# Patient Record
Sex: Female | Born: 1970 | Race: White | Hispanic: No | Smoking: Never smoker
Health system: Southern US, Community
[De-identification: ages and names within clinical notes are randomized; demographics above are authoritative.]

## PROBLEM LIST (undated history)

## (undated) DIAGNOSIS — K5792 Diverticulitis of intestine, part unspecified, without perforation or abscess without bleeding: Secondary | ICD-10-CM

## (undated) HISTORY — PX: APPENDECTOMY: SHX54

## (undated) HISTORY — PX: HERNIA REPAIR: SHX51

## (undated) HISTORY — PX: ABDOMINAL SURGERY: SHX537

## (undated) HISTORY — PX: OTHER SURGICAL HISTORY: SHX169

---

## 2003-03-05 ENCOUNTER — Other Ambulatory Visit: Payer: Self-pay

## 2003-10-15 ENCOUNTER — Other Ambulatory Visit: Payer: Self-pay

## 2003-12-18 ENCOUNTER — Emergency Department: Payer: Self-pay | Admitting: General Practice

## 2004-01-24 ENCOUNTER — Ambulatory Visit: Payer: Self-pay | Admitting: General Surgery

## 2004-02-03 ENCOUNTER — Ambulatory Visit: Payer: Self-pay | Admitting: General Surgery

## 2004-02-16 ENCOUNTER — Emergency Department: Payer: Self-pay | Admitting: Emergency Medicine

## 2004-02-17 ENCOUNTER — Ambulatory Visit: Payer: Self-pay | Admitting: Emergency Medicine

## 2004-04-05 ENCOUNTER — Emergency Department: Payer: Self-pay | Admitting: Unknown Physician Specialty

## 2007-07-06 ENCOUNTER — Emergency Department: Payer: Self-pay | Admitting: Emergency Medicine

## 2007-07-06 ENCOUNTER — Other Ambulatory Visit: Payer: Self-pay

## 2008-10-23 ENCOUNTER — Emergency Department: Payer: Self-pay | Admitting: Emergency Medicine

## 2010-04-25 ENCOUNTER — Ambulatory Visit: Payer: Self-pay | Admitting: Family Medicine

## 2010-05-11 ENCOUNTER — Ambulatory Visit: Payer: Self-pay | Admitting: General Surgery

## 2010-06-07 ENCOUNTER — Emergency Department: Payer: Self-pay | Admitting: Emergency Medicine

## 2010-06-11 ENCOUNTER — Emergency Department: Payer: Self-pay | Admitting: Emergency Medicine

## 2015-01-31 ENCOUNTER — Emergency Department: Payer: Self-pay

## 2015-01-31 ENCOUNTER — Encounter: Payer: Self-pay | Admitting: Emergency Medicine

## 2015-01-31 ENCOUNTER — Emergency Department
Admission: EM | Admit: 2015-01-31 | Discharge: 2015-02-01 | Disposition: A | Discharge status: Home / Self Care | Payer: Self-pay | Attending: Emergency Medicine | Admitting: Emergency Medicine

## 2015-01-31 DIAGNOSIS — R1010 Upper abdominal pain, unspecified: Secondary | ICD-10-CM | POA: Insufficient documentation

## 2015-01-31 DIAGNOSIS — Z3202 Encounter for pregnancy test, result negative: Secondary | ICD-10-CM | POA: Insufficient documentation

## 2015-01-31 DIAGNOSIS — R110 Nausea: Secondary | ICD-10-CM | POA: Insufficient documentation

## 2015-01-31 HISTORY — DX: Diverticulitis of intestine, part unspecified, without perforation or abscess without bleeding: K57.92

## 2015-01-31 LAB — CBC
HCT: 44.2 % (ref 35.0–47.0)
Hemoglobin: 14.3 g/dL (ref 12.0–16.0)
MCH: 30.1 pg (ref 26.0–34.0)
MCHC: 32.3 g/dL (ref 32.0–36.0)
MCV: 93.1 fL (ref 80.0–100.0)
Platelets: 215 10*3/uL (ref 150–440)
RBC: 4.75 MIL/uL (ref 3.80–5.20)
RDW: 12.8 % (ref 11.5–14.5)
WBC: 12.9 10*3/uL — ABNORMAL HIGH (ref 3.6–11.0)

## 2015-01-31 LAB — COMPREHENSIVE METABOLIC PANEL
ALBUMIN: 4.8 g/dL (ref 3.5–5.0)
ALK PHOS: 49 U/L (ref 38–126)
ALT: 12 U/L — AB (ref 14–54)
AST: 18 U/L (ref 15–41)
Anion gap: 7 (ref 5–15)
BILIRUBIN TOTAL: 0.1 mg/dL — AB (ref 0.3–1.2)
BUN: 10 mg/dL (ref 6–20)
CALCIUM: 9.3 mg/dL (ref 8.9–10.3)
CO2: 25 mmol/L (ref 22–32)
Chloride: 106 mmol/L (ref 101–111)
Creatinine, Ser: 0.73 mg/dL (ref 0.44–1.00)
GFR calc Af Amer: >60 mL/min (ref >60)
GFR calc non Af Amer: >60 mL/min (ref >60)
GLUCOSE: 129 mg/dL — AB (ref 65–99)
Potassium: 3.8 mmol/L (ref 3.5–5.1)
Sodium: 138 mmol/L (ref 135–145)
TOTAL PROTEIN: 7.7 g/dL (ref 6.5–8.1)

## 2015-01-31 LAB — URINALYSIS COMPLETE WITH MICROSCOPIC (ARMC ONLY)
BACTERIA UA: NONE SEEN
Bilirubin Urine: NEGATIVE
GLUCOSE, UA: NEGATIVE mg/dL
Ketones, ur: NEGATIVE mg/dL
NITRITE: NEGATIVE
Protein, ur: NEGATIVE mg/dL
SPECIFIC GRAVITY, URINE: 1.018 (ref 1.005–1.030)
pH: 6 (ref 5.0–8.0)

## 2015-01-31 LAB — POCT PREGNANCY, URINE: Preg Test, Ur: NEGATIVE

## 2015-01-31 LAB — TROPONIN I

## 2015-01-31 LAB — LIPASE, BLOOD: Lipase: 26 U/L (ref 11–51)

## 2015-01-31 MED ORDER — IOHEXOL 300 MG/ML  SOLN
100.0000 mL | Freq: Once | INTRAMUSCULAR | Status: AC | PRN
  Administered 2015-01-31: 100 mL via INTRAVENOUS

## 2015-01-31 MED ORDER — MORPHINE SULFATE (PF) 4 MG/ML IV SOLN
4.0000 mg | Freq: Once | INTRAVENOUS | Status: AC
  Administered 2015-01-31: 4 mg via INTRAVENOUS
  Filled 2015-01-31: qty 1

## 2015-01-31 MED ORDER — ONDANSETRON HCL 4 MG/2ML IJ SOLN
4.0000 mg | Freq: Once | INTRAMUSCULAR | Status: AC
  Administered 2015-01-31: 4 mg via INTRAVENOUS
  Filled 2015-01-31: qty 2

## 2015-01-31 MED ORDER — SODIUM CHLORIDE 0.9 % IV BOLUS (SEPSIS)
1000.0000 mL | Freq: Once | INTRAVENOUS | Status: AC
  Administered 2015-01-31: 1000 mL via INTRAVENOUS

## 2015-01-31 MED ORDER — IOHEXOL 240 MG/ML SOLN
25.0000 mL | Freq: Once | INTRAMUSCULAR | Status: AC | PRN
  Administered 2015-01-31: 25 mL via ORAL

## 2015-01-31 NOTE — ED Notes (Signed)
Patient presents with c/o upper abd pain x 2 weeks; worse x 2-3 days. Patient reports PMH significant for hernia repair x 3 and celiac artery decompression. Patient reports that she has been doing a lot of lifting; questions hernia rupture. (+) nausea.  

## 2015-01-31 NOTE — ED Provider Notes (Signed)
[facility] Emergency Department Provider Note  ____________________________________________  Time seen: 11:00 PM  I have reviewed the triage vital signs and the nursing notes.   HISTORY  Chief Complaint Abdominal Pain    HPI Alyssa Page is a 44 y.o. female presents with upper abdominal pain 2 weeks with acute worsening the past 2-3 days. Patient states current pain score 7 out of 10 and accompanied by nausea however no vomiting or diarrhea. Of note the patient has a history of celiac artery decompression as well as abdominal hernias and diverticulitis.     Past medical history Celiac artery decompression There are no active problems to display for this patient.   Past Surgical History  Procedure Laterality Date  . Hernia repair      x 3  . Celiac artery decompression      No current outpatient prescriptions on file.  Allergies Zantac  No family history on file.  Social History Social History  Substance Use Topics  . Smoking status: Never Smoker   . Smokeless tobacco: None  . Alcohol Use: Yes     Comment: "Occassional glass of wine"    Review of Systems  Constitutional: Negative for fever. Eyes: Negative for visual changes. ENT: Negative for sore throat. Cardiovascular: Negative for chest pain. Respiratory: Negative for shortness of breath. Gastrointestinal: Positive for abdominal pain and nausea Genitourinary: Negative for dysuria. Musculoskeletal: Negative for back pain. Skin: Negative for rash. Neurological: Negative for headaches, focal weakness or numbness.  10-point ROS otherwise negative.  ____________________________________________   PHYSICAL EXAM:  VITAL SIGNS: ED Triage Vitals  Enc Vitals Group     BP 01/31/15 2055 154/90 mmHg     Pulse Rate 01/31/15 2055 89     Resp 01/31/15 2055 16     Temp 01/31/15 2055 98.3 F (36.8 C)     Temp Source 01/31/15 2055 Oral     SpO2 01/31/15 2055 100 %     Weight  01/31/15 2055 160 lb (72.576 kg)     Height 01/31/15 2055  (1.6 m)     Head Cir --      Peak Flow --      Pain Score 01/31/15 2055 6     Pain Loc --      Pain Edu? --      Excl. in GC? --      Constitutional: Alert and oriented. Well appearing and in no distress. Eyes: Conjunctivae are normal. PERRL. Normal extraocular movements. ENT   Head: Normocephalic and atraumatic.   Nose: No congestion/rhinnorhea.   Mouth/Throat: Mucous membranes are moist.   Neck: No stridor. Hematological/Lymphatic/Immunilogical: No cervical lymphadenopathy. Cardiovascular: Normal rate, regular rhythm. Normal and symmetric distal pulses are present in all extremities. No murmurs, rubs, or gallops. Respiratory: Normal respiratory effort without tachypnea nor retractions. Breath sounds are clear and equal bilaterally. No wheezes/rales/rhonchi. Gastrointestinal: Tender to palpation right upper quadrant epigastric and left upper quadrant.. No distention. There is no CVA tenderness. Genitourinary: deferred Musculoskeletal: Nontender with normal range of motion in all extremities. No joint effusions.  No lower extremity tenderness nor edema. Neurologic:  Normal speech and language. No gross focal neurologic deficits are appreciated. Speech is normal.  Skin:  Skin is warm, dry and intact. No rash noted. Psychiatric: Mood and affect are normal. Speech and behavior are normal. Patient exhibits appropriate insight and judgment.  ____________________________________________    LABS (pertinent positives/negatives)  Labs Reviewed  COMPREHENSIVE METABOLIC PANEL - Abnormal; Notable for the following:  Glucose, Bld 129 (*)    ALT 12 (*)    Total Bilirubin 0.1 (*)    All other components within normal limits  CBC - Abnormal; Notable for the following:    WBC 12.9 (*)    All other components within normal limits  URINALYSIS COMPLETEWITH MICROSCOPIC ([facility] ONLY) - Abnormal; Notable for the  following:    Color, Urine YELLOW (*)    APPearance HAZY (*)    Hgb urine dipstick 1+ (*)    Leukocytes, UA TRACE (*)    Squamous Epithelial / LPF 6-30 (*)    All other components within normal limits  LIPASE, BLOOD  TROPONIN I  POCT PREGNANCY, URINE      RADIOLOGY  CT Abdomen Pelvis W Contrast (Final result) Result time: 01/31/15 23:48:07   Final result by Rad Results In Interface (01/31/15 23:48:07)   Narrative:   CLINICAL DATA: Upper abdominal pain for 3 days.  EXAM: CT ABDOMEN AND PELVIS WITH CONTRAST  TECHNIQUE: Multidetector CT imaging of the abdomen and pelvis was performed using the standard protocol following bolus administration of intravenous contrast.  CONTRAST: 100mL OMNIPAQUE IOHEXOL 300 MG/ML SOLN  COMPARISON: 05/11/2010  FINDINGS: Lower chest: No significant abnormality  Hepatobiliary: There are normal appearances of the liver, gallbladder and bile ducts.  Pancreas: Normal  Spleen: Normal  Adrenals/Urinary Tract: The adrenals and kidneys are normal in appearance. There is no urinary calculus evident. There is no hydronephrosis or ureteral dilatation. Collecting systems and ureters appear unremarkable.  Stomach/Bowel: The stomach, small bowel and colon are unremarkable.  Vascular/Lymphatic: The abdominal aorta is normal in caliber. There is no atherosclerotic calcification. There is no adenopathy in the abdomen or pelvis.  Reproductive: Uterus and ovaries are normal.  Other: No acute inflammatory changes are evident in the abdomen or pelvis. There is no ascites.  Musculoskeletal: No significant abnormality.  IMPRESSION: No significant abnormality.   Electronically Signed By: Ellery Plunkaniel R Mitchell M.D. On: 01/31/2015 23:48      INITIAL IMPRESSION / ASSESSMENT AND PLAN / ED COURSE  Pertinent labs & imaging results that were available during my care of the patient were reviewed by me and considered in my medical decision  making (see chart for details).  Given H&P concern for intra-abdominal pathology however CT abdomen negative. [facility] evaluation.  ____________________________________________   FINAL CLINICAL IMPRESSION(S) / ED DIAGNOSES  Final diagnoses:  Pain of upper abdomen      Darci Currentandolph N Brown, MD 02/01/15 [id number]

## 2015-01-31 NOTE — ED Notes (Addendum)
Pt presents to ED presenting with upper abdominal pain for 3 days. Pt states she has had 4 abdominal surgeries in the past. Pt has hx of hernia, diverticulitis, has mesh in abdomen. Pt states she still has gallbladder.

## 2015-01-31 NOTE — ED Notes (Signed)
Dr. Brown at bedside

## 2015-01-31 NOTE — ED Notes (Signed)
Patient presents with c/o upper abd pain x 2 weeks; worse x 2-3 days. Patient reports PMH significant for hernia repair x 3 and celiac artery decompression. Patient reports that she has been doing a lot of lifting; questions hernia rupture. (+) nausea.

## 2015-01-31 NOTE — ED Notes (Signed)
Pt transported to CT via stretcher.  

## 2015-02-01 MED ORDER — OXYCODONE-ACETAMINOPHEN 5-325 MG PO TABS: 1.0000 | ORAL_TABLET | ORAL | Status: AC | PRN

## 2015-02-01 NOTE — Discharge Instructions (Signed)

## 2015-02-01 NOTE — ED Notes (Signed)
Dr. Brown at bedside

## 2015-11-28 ENCOUNTER — Emergency Department
Admission: EM | Admit: 2015-11-28 | Discharge: 2015-11-28 | Disposition: A | Discharge status: Home / Self Care | Payer: 59 | Attending: Emergency Medicine | Admitting: Emergency Medicine

## 2015-11-28 ENCOUNTER — Encounter: Payer: Self-pay | Admitting: Medical Oncology

## 2015-11-28 ENCOUNTER — Emergency Department: Payer: 59

## 2015-11-28 DIAGNOSIS — R0789 Other chest pain: Secondary | ICD-10-CM

## 2015-11-28 LAB — CBC
HEMATOCRIT: 42.5 % (ref 35.0–47.0)
HEMOGLOBIN: 14.3 g/dL (ref 12.0–16.0)
MCH: 31.4 pg (ref 26.0–34.0)
MCHC: 33.6 g/dL (ref 32.0–36.0)
MCV: 93.4 fL (ref 80.0–100.0)
Platelets: 263 10*3/uL (ref 150–440)
RBC: 4.56 MIL/uL (ref 3.80–5.20)
RDW: 13 % (ref 11.5–14.5)
WBC: 11.7 10*3/uL — AB (ref 3.6–11.0)

## 2015-11-28 LAB — BASIC METABOLIC PANEL
ANION GAP: 7 (ref 5–15)
BUN: 8 mg/dL (ref 6–20)
CO2: 23 mmol/L (ref 22–32)
Calcium: 8.6 mg/dL — ABNORMAL LOW (ref 8.9–10.3)
Chloride: 106 mmol/L (ref 101–111)
Creatinine, Ser: 0.79 mg/dL (ref 0.44–1.00)
GFR calc Af Amer: >60 mL/min (ref >60)
GLUCOSE: 92 mg/dL (ref 65–99)
POTASSIUM: 3.8 mmol/L (ref 3.5–5.1)
Sodium: 136 mmol/L (ref 135–145)

## 2015-11-28 LAB — TROPONIN I: Troponin I: <0.03 ng/mL (ref <0.03)

## 2015-11-28 MED ORDER — CARISOPRODOL 350 MG PO TABS: 350.0000 mg | ORAL_TABLET | Freq: Three times a day (TID) | ORAL | 0 refills | Status: AC | PRN

## 2015-11-28 NOTE — ED Notes (Signed)
Dr. Schaevitz at bedside.  

## 2015-11-28 NOTE — ED Notes (Signed)
Pt states x 2 month intermittent pain to L side around rib cage. Denies kidney stone hx, denies urinary symptoms. States pain with come on and stay for a week and then go away for a week. Pt denies known injury to area. States pain with deep breathing.

## 2015-11-28 NOTE — ED Triage Notes (Signed)
Pt reports for the past 2 months she has been having intermittent left sided rib pain that wraps around to her back.

## 2015-11-28 NOTE — ED Provider Notes (Signed)
[facility] Emergency Department Provider Note   ____________________________________________   First MD Initiated Contact with Patient 11/28/15 1453     (approximate)  I have reviewed the triage vital signs and the nursing notes.   HISTORY  Chief Complaint Flank Pain and Chest Pain   HPI Alyssa Page is a 45 y.o. female with a history of diverticulitis as well as multiple hernia repairs was presented to emergency department with left lower chest and left flank pain. She says that the symptoms have been off and on over the past 2 months. She says that the pain will last week and then go away for a week. She says that it is worse with deep breathing and is a 6 out of 10 in intensity at this time. She denies any associated symptoms is nausea vomiting, diarrhea or shortness of breath. She says that the symptoms are also worsened at her job. She says the reason she is here today is because her job wanted her to be evaluated. 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 She says that the pain is worsened with heavy lifting but she persists because it is the requirements of her employment.  She says she is on Depo-Provera and also has her tubes tied. Denies any burning with urination or blood in her urine. Says that the pain as a cramping pain and is improved when she puts pressure on it with her hand. She says that she has been using icy hot, heating pad as well as ibuprofen and Aleve at home without relief.   Past Medical History:  Diagnosis Date  . Diverticulitis     There are no active problems to display for this patient.   Past Surgical History:  Procedure Laterality Date  . ABDOMINAL SURGERY    . APPENDECTOMY    . Celiac artery decompression    . HERNIA REPAIR     x 3    Prior to Admission medications   Medication Sig Start Date End Date Taking? Authorizing Provider  oxyCODONE-acetaminophen (ROXICET)  5-325 MG tablet Take 1 tablet by mouth every 4 (four) hours as needed for severe pain. 02/01/15   Darci Currentandolph N Brown, MD    Allergies Zantac [ranitidine hcl]  No family history on file.  Social History Social History  Substance Use Topics  . Smoking status: Never Smoker  . Smokeless tobacco: Not on file  . Alcohol use Yes     Comment: "Occassional glass of wine"    Review of Systems \\Constitutional : No fever/chills Eyes: No visual changes. ENT: No sore throat. Cardiovascular:As above Respiratory: Denies shortness of breath. Gastrointestinal: No abdominal pain.  No nausea, no vomiting.  No diarrhea.  No constipation. Genitourinary: Negative for dysuria. Musculoskeletal: Negative for back pain. Skin: Negative for rash. Neurological: Negative for headaches, focal weakness or numbness. ` 10-point ROS otherwise negative.  ____________________________________________   PHYSICAL EXAM:  VITAL SIGNS: ED Triage Vitals [11/28/15 1404]  Enc Vitals Group     BP (!) 151/87     Pulse Rate 86     Resp 17     Temp 98.4 F (36.9 C)     Temp Source Oral     SpO2 100 %     Weight 150 lb (68 kg)     Height 5\' 3"  (1.6 m)     Head Circumference      Peak Flow      Pain Score 7  Pain Loc      Pain Edu?      Excl. in GC?     Constitutional: Alert and oriented. Well appearing and in no acute distress. Eyes: Conjunctivae are normal. PERRL. EOMI. Head: Atraumatic. Nose: No congestion/rhinnorhea. Mouth/Throat: Mucous membranes are moist.   Neck: No stridor.   Cardiovascular: Normal rate, regular rhythm. Grossly normal heart sounds.   Respiratory: Normal respiratory effort.  No retractions. Lungs CTAB. Gastrointestinal: Soft and nontender. No distention. No CVA tenderness. Musculoskeletal: No lower extremity tenderness nor edema.  No joint effusions.  Tenderness to the left lower thorax over ribs 9 through 11 anteriorly without any crepitus, no overlying ecchymosis or bruising.  Tenderness to palpation is mild. No focal/point tenderness palpation. Neurologic:  Normal speech and language. No gross focal neurologic deficits are appreciated. Skin:  Skin is warm, dry and intact. No rash noted. Psychiatric: Mood and affect are normal. Speech and behavior are normal.  ____________________________________________   LABS (all labs ordered are listed, but only abnormal results are displayed)  Labs Reviewed  BASIC METABOLIC PANEL - Abnormal; Notable for the following:       Result Value   Calcium 8.6 (*)    All other components within normal limits  CBC - Abnormal; Notable for the following:    WBC 11.7 (*)    All other components within normal limits  TROPONIN I   ____________________________________________  EKG  ED ECG REPORT I, Arelia Longest, the attending physician, personally viewed and interpreted this ECG.   Date: 11/28/2015  EKG Time: 1410  Rate: 69  Rhythm: normal sinus rhythm  Axis: Normal axis  Intervals:none  ST&T Change: No ST segment elevation or depression. No ab normal T-wave inversion.  ____________________________________________  RADIOLOGY  DG Chest 2 View (Accession [id number]) (Order [id number])  Imaging  Date: 11/28/2015 Department: [facility] EMERGENCY DEPARTMENT Released By: Jyl Heinz, RN (auto-released) Authorizing: Myrna Blazer, MD  PACS Images   Show images for DG Chest 2 View  Study Result   CLINICAL DATA:  Rib pain for 2 months on the left.  EXAM: CHEST  2 VIEW  COMPARISON:  10/23/2008  FINDINGS: No findings in the ribs to explain pain. No focal or notable thoracic spine finding. There is no edema, consolidation, effusion, or pneumothorax. Normal heart size and mediastinal contours.  IMPRESSION: Stable, negative chest.   Electronically Signed   By: Marnee Spring M.D.   On: 11/28/2015 15:06      ____________________________________________   PROCEDURES  Procedure(s) performed:   Procedures  Critical Care performed:   ____________________________________________   INITIAL IMPRESSION / ASSESSMENT AND PLAN / ED COURSE  Pertinent labs & imaging results that were available during my care of the patient were reviewed by me and considered in my medical decision making (see chart for details).  Patient with likely chest wall pain. Multiple months of symptoms with a very reassuring workup. We'll add a muscle relaxer to her medications for symptomatic relief. Will be discharged home. Has an appointment with a doctor from [facility] this Monday for primary care. Elevated blood pressure but will need blood pressure recheck. Unclear if this is essential hypertension versus whitecoat hypertension versus pain related.  PERC negative.   Clinical Course     ____________________________________________   FINAL CLINICAL IMPRESSION(S) / ED DIAGNOSES  Chest wall pain.    NEW MEDICATIONS STARTED DURING THIS VISIT:  New Prescriptions   No medications on file     Note:  This document was prepared using Dragon voice recognition software and may include unintentional dictation errors.    Myrna Blazer, MD 11/28/15 [phone]

## 2015-12-28 ENCOUNTER — Emergency Department: Payer: 59

## 2015-12-28 ENCOUNTER — Emergency Department
Admission: EM | Admit: 2015-12-28 | Discharge: 2015-12-28 | Disposition: A | Discharge status: Home / Self Care | Payer: 59 | Attending: Emergency Medicine | Admitting: Emergency Medicine

## 2015-12-28 DIAGNOSIS — R002 Palpitations: Secondary | ICD-10-CM | POA: Insufficient documentation

## 2015-12-28 DIAGNOSIS — R0789 Other chest pain: Secondary | ICD-10-CM | POA: Diagnosis not present

## 2015-12-28 DIAGNOSIS — Z791 Long term (current) use of non-steroidal anti-inflammatories (NSAID): Secondary | ICD-10-CM | POA: Diagnosis not present

## 2015-12-28 LAB — BASIC METABOLIC PANEL
ANION GAP: 9 (ref 5–15)
BUN: 9 mg/dL (ref 6–20)
CALCIUM: 8.9 mg/dL (ref 8.9–10.3)
CO2: 24 mmol/L (ref 22–32)
Chloride: 104 mmol/L (ref 101–111)
Creatinine, Ser: 0.7 mg/dL (ref 0.44–1.00)
GFR calc Af Amer: >60 mL/min (ref >60)
GLUCOSE: 138 mg/dL — AB (ref 65–99)
POTASSIUM: 3.8 mmol/L (ref 3.5–5.1)
SODIUM: 137 mmol/L (ref 135–145)

## 2015-12-28 LAB — CBC
HEMATOCRIT: 43 % (ref 35.0–47.0)
HEMOGLOBIN: 14.4 g/dL (ref 12.0–16.0)
MCH: 31.2 pg (ref 26.0–34.0)
MCHC: 33.6 g/dL (ref 32.0–36.0)
MCV: 93 fL (ref 80.0–100.0)
Platelets: 255 10*3/uL (ref 150–440)
RBC: 4.62 MIL/uL (ref 3.80–5.20)
RDW: 12.8 % (ref 11.5–14.5)
WBC: 15.6 10*3/uL — AB (ref 3.6–11.0)

## 2015-12-28 LAB — TROPONIN I

## 2015-12-28 NOTE — ED Triage Notes (Signed)
Pt feels like her heart is racing X 2 hours, nausea, lightheaded. Pt alert and oriented X4, active, cooperative, pt in NAD. RR even and unlabored, color WNL.

## 2015-12-28 NOTE — ED Provider Notes (Signed)
[facility] Emergency Department Provider Note  ____________________________________________   First MD Initiated Contact with Patient 12/28/15 1720     (approximate)  I have reviewed the triage vital signs and the nursing notes.   HISTORY  Chief Complaint Palpitations   HPI Alyssa Page is a 45 y.o. female with a history of diverticulitis with several months of on and off palpitations. She said that she was having palpitations earlier today that started at work. She said that she was sitting at work when the palpitations started. She says that it feels like her heart is racing and there is mild chest pressure. She has been seen in this ER about one month ago as well as at [facility] with her primary care doctor. She has worn a monitor but is waiting for those results. She was seen by her primary care doctor several days ago who diagnosed with with pleurisy. The patient had a negative d-dimer at that time. The patient is symptom-free at this time. No shortness of breath. Denies any nausea vomiting or diarrhea.   Past Medical History:  Diagnosis Date  . Diverticulitis     There are no active problems to display for this patient.   Past Surgical History:  Procedure Laterality Date  . ABDOMINAL SURGERY    . APPENDECTOMY    . Celiac artery decompression    . HERNIA REPAIR     x 3    Prior to Admission medications   Medication Sig Start Date End Date Taking? Authorizing Provider  carisoprodol (SOMA) 350 MG tablet Take 1 tablet (350 mg total) by mouth 3 (three) times daily as needed for muscle spasms. 11/28/15 11/27/16  Myrna Blazer, MD  oxyCODONE-acetaminophen (ROXICET) 5-325 MG tablet Take 1 tablet by mouth every 4 (four) hours as needed for severe pain. 02/01/15   Darci Current, MD    Allergies Zantac [ranitidine hcl]  No family history on file.  Social History Social History  Substance Use Topics  . Smoking status: Never Smoker  .  Smokeless tobacco: Not on file  . Alcohol use Yes     Comment: "Occassional glass of wine"    Review of Systems Constitutional: No fever/chills Eyes: No visual changes. ENT: No sore throat. Cardiovascular: As above Respiratory: Denies shortness of breath. Gastrointestinal: No abdominal pain.  No nausea, no vomiting.  No diarrhea.  No constipation. Genitourinary: Negative for dysuria. Musculoskeletal: Negative for back pain. Skin: Negative for rash. Neurological: Negative for headaches, focal weakness or numbness.  10-point ROS otherwise negative.  ____________________________________________   PHYSICAL EXAM:  VITAL SIGNS: ED Triage Vitals  Enc Vitals Group     BP 12/28/15 1620 (!) 152/93     Pulse Rate 12/28/15 1620 (!) 108     Resp 12/28/15 1620 18     Temp 12/28/15 1620 98.8 F (37.1 C)     Temp Source 12/28/15 1620 Oral     SpO2 12/28/15 1620 99 %     Weight 12/28/15 1621 150 lb (68 kg)     Height 12/28/15 1621 5\' 3"  (1.6 m)     Head Circumference --      Peak Flow --      Pain Score --      Pain Loc --      Pain Edu? --      Excl. in GC? --     Constitutional: Alert and oriented. Well appearing and in no acute distress. Eyes: Conjunctivae are normal. PERRL. EOMI.  Head: Atraumatic. Nose: No congestion/rhinnorhea. Mouth/Throat: Mucous membranes are moist.   Neck: No stridor.    Cardiovascular: Normal rate, regular rhythm. Grossly normal heart sounds.  Heart rate of 84 and the monitor in the room. Respiratory: Normal respiratory effort.  No retractions. Lungs CTAB. Gastrointestinal: Soft and nontender. No distention.  Musculoskeletal: No lower extremity tenderness nor edema.  No joint effusions. Neurologic:  Normal speech and language. No gross focal neurologic deficits are appreciated. No gait instability. Skin:  Skin is warm, dry and intact. No rash noted. Psychiatric: Mood and affect are normal. Speech and behavior are  normal.  ____________________________________________   LABS (all labs ordered are listed, but only abnormal results are displayed)  Labs Reviewed  BASIC METABOLIC PANEL - Abnormal; Notable for the following:       Result Value   Glucose, Bld 138 (*)    All other components within normal limits  CBC - Abnormal; Notable for the following:    WBC 15.6 (*)    All other components within normal limits  TROPONIN I   ____________________________________________  EKG  ED ECG REPORT I, Arelia LongestSchaevitz,  Ilya Neely M, the attending physician, personally viewed and interpreted this ECG.   Date: 12/28/2015  EKG Time: 1619  Rate: 105  Rhythm: sinus tachycardia  Axis: Normal  Intervals:none  ST&T Change: No ST segment elevation or depression. No abnormal T-wave inversion.  ____________________________________________  RADIOLOGY DG Chest 2 View (Accession [id number]) (Order [id number])  Imaging  Date: 12/28/2015 Department: [facility] EMERGENCY DEPARTMENT Released By: Lynelle SmokeAllyson Y Beckstrand, RN (auto-released) Authorizing: Myrna Blazeravid Matthew Pavel Gadd, MD  Exam Information   Status Exam Begun  Exam Ended   Final [99] 12/28/2015 4:40 PM 12/28/2015 4:52 PM  PACS Images   Show images for DG Chest 2 View  Study Result   CLINICAL DATA:  Tachycardia  EXAM: CHEST  2 VIEW  COMPARISON:  11/28/2015  FINDINGS: Cardiac shadow is within normal limits. The lungs are well aerated bilaterally. No acute bony abnormality is seen. Postsurgical changes in the abdomen are again noted.  IMPRESSION: No active cardiopulmonary disease.   Electronically Signed   By: Alcide CleverMark  Lukens M.D.   On: 12/28/2015 16:56      ____________________________________________   PROCEDURES  Procedure(s) performed:   Procedures  Critical Care performed:   ____________________________________________   INITIAL IMPRESSION / ASSESSMENT AND PLAN / ED COURSE  Pertinent labs & imaging results  that were available during my care of the patient were reviewed by me and considered in my medical decision making (see chart for details).  ----------------------------------------- 6:28 PM on 12/28/2015 -----------------------------------------  Unclear cause of the patient's symptoms but she has had multiple workups over the past month. Reassuring workup here. Elevated white blood cell count but recently on steroids for pleurisy. Recently with negative d-dimer. Awaiting Holter monitor results. I do not see emergent pathology and the patient has had her symptoms resolved. She will be discharged home. She'll follow up as an outpatient with her care team at [facility]. She is understanding of this plan and willing to comply. Will be discharged home.  Clinical Course     ____________________________________________   FINAL CLINICAL IMPRESSION(S) / ED DIAGNOSES  Palpitations.    NEW MEDICATIONS STARTED DURING THIS VISIT:  New Prescriptions   No medications on file     Note:  This document was prepared using Dragon voice recognition software and may include unintentional dictation errors.    Myrna Blazeravid Matthew Temperence Zenor, MD 12/28/15 [phone]

## 2016-02-29 ENCOUNTER — Emergency Department: Payer: 59

## 2016-02-29 ENCOUNTER — Emergency Department
Admission: EM | Admit: 2016-02-29 | Discharge: 2016-02-29 | Disposition: A | Discharge status: Home / Self Care | Payer: 59 | Attending: Emergency Medicine | Admitting: Emergency Medicine

## 2016-02-29 ENCOUNTER — Encounter: Payer: Self-pay | Admitting: Emergency Medicine

## 2016-02-29 DIAGNOSIS — J9811 Atelectasis: Secondary | ICD-10-CM | POA: Insufficient documentation

## 2016-02-29 DIAGNOSIS — R05 Cough: Secondary | ICD-10-CM | POA: Diagnosis present

## 2016-02-29 MED ORDER — AZITHROMYCIN 250 MG PO TABS: ORAL_TABLET | ORAL | 0 refills | Status: AC

## 2016-02-29 MED ORDER — IPRATROPIUM-ALBUTEROL 0.5-2.5 (3) MG/3ML IN SOLN
3.0000 mL | Freq: Once | RESPIRATORY_TRACT | Status: DC
Start: 2016-02-29 — End: 2016-02-29

## 2016-02-29 MED ORDER — IPRATROPIUM-ALBUTEROL 0.5-2.5 (3) MG/3ML IN SOLN
RESPIRATORY_TRACT | Status: AC
  Administered 2016-02-29: 3 mL
  Filled 2016-02-29: qty 3

## 2016-02-29 MED ORDER — PSEUDOEPH-BROMPHEN-DM 30-2-10 MG/5ML PO SYRP: 5.0000 mL | ORAL_SOLUTION | Freq: Four times a day (QID) | ORAL | 0 refills | Status: AC | PRN

## 2016-02-29 MED ORDER — IBUPROFEN 600 MG PO TABS: 600.0000 mg | ORAL_TABLET | Freq: Three times a day (TID) | ORAL | 0 refills | Status: AC | PRN

## 2016-02-29 MED ORDER — BENZONATATE 100 MG PO CAPS
200.0000 mg | ORAL_CAPSULE | Freq: Once | ORAL | Status: AC
  Administered 2016-02-29: 200 mg via ORAL
  Filled 2016-02-29: qty 2

## 2016-02-29 NOTE — ED Notes (Signed)
See triage note cough for about 1 week   No fever  Cough is non prod

## 2016-02-29 NOTE — ED Provider Notes (Signed)
[facility] Emergency Department Provider Note   ____________________________________________   First MD Initiated Contact with Patient 02/29/16 [phone]     (approximate)  I have reviewed the triage vital signs and the nursing notes.   HISTORY  Chief Complaint Cough    HPI Carver FilaMelissa D Breitenstein is a 45 y.o. female patient complaining of cough which is nonproductive for 1 week. Patient state no relief over-the-counter medications. Patient is a positive tobacco use.Patient states chest wall pain secondary to continue cough. Patient rates the pain as a 5/10. Patient described a pain as "achy". Patient denies fever with this complaint. Patient denies nasal congestion or runny nose. Patient denies nausea, vomiting, or diarrhea Past Medical History:  Diagnosis Date  . Diverticulitis     There are no active problems to display for this patient.   Past Surgical History:  Procedure Laterality Date  . ABDOMINAL SURGERY    . APPENDECTOMY    . Celiac artery decompression    . HERNIA REPAIR     x 3    Prior to Admission medications   Medication Sig Start Date End Date Taking? Authorizing Provider  azithromycin (ZITHROMAX Z-PAK) 250 MG tablet Take 2 tablets (500 mg) on  Day 1,  followed by 1 tablet (250 mg) once daily on Days 2 through 5. 02/29/16 03/05/16  Joni Reiningonald K Gean Larose, PA-C  brompheniramine-pseudoephedrine-DM 30-2-10 MG/5ML syrup Take 5 mLs by mouth 4 (four) times daily as needed. 02/29/16   Joni Reiningonald K Raven Furnas, PA-C  carisoprodol (SOMA) 350 MG tablet Take 1 tablet (350 mg total) by mouth 3 (three) times daily as needed for muscle spasms. 11/28/15 11/27/16  Myrna Blazeravid Matthew Schaevitz, MD  ibuprofen (ADVIL,MOTRIN) 600 MG tablet Take 1 tablet (600 mg total) by mouth every 8 (eight) hours as needed. 02/29/16   Joni Reiningonald K Zayonna Ayuso, PA-C  oxyCODONE-acetaminophen (ROXICET) 5-325 MG tablet Take 1 tablet by mouth every 4 (four) hours as needed for severe pain. 02/01/15   Darci Currentandolph N Brown,  MD    Allergies Zantac [ranitidine hcl]  No family history on file.  Social History Social History  Substance Use Topics  . Smoking status: Never Smoker  . Smokeless tobacco: Never Used  . Alcohol use Yes     Comment: "Occassional glass of wine"    Review of Systems Constitutional: No fever/chills Eyes: No visual changes. ENT: No sore throat. Cardiovascular: Denies chest pain. Respiratory: Denies shortness of breath. Nonproductive cough Gastrointestinal: No abdominal pain.  No nausea, no vomiting.  No diarrhea.  No constipation. Genitourinary: Negative for dysuria. Musculoskeletal: Chest wall pain secondary to continue cough Skin: Negative for rash. Neurological: Negative for headaches, focal weakness or numbness.    ____________________________________________   PHYSICAL EXAM:  VITAL SIGNS: ED Triage Vitals  Enc Vitals Group     BP 02/29/16 0833 (!) 123/99     Pulse Rate 02/29/16 0833 98     Resp 02/29/16 0833 20     Temp 02/29/16 0833 98.1 F (36.7 C)     Temp Source 02/29/16 0833 Oral     SpO2 02/29/16 0833 98 %     Weight 02/29/16 0834 150 lb (68 kg)     Height 02/29/16 0834 5\' 3"  (1.6 m)     Head Circumference --      Peak Flow --      Pain Score --      Pain Loc --      Pain Edu? --      Excl. in GC? --  Constitutional: Alert and oriented. Well appearing and in no acute distress. Eyes: Conjunctivae are normal. PERRL. EOMI. Head: Atraumatic. Nose: No congestion/rhinnorhea. Mouth/Throat: Mucous membranes are moist.  Oropharynx non-erythematous. Neck: No stridor.  No cervical spine tenderness to palpation. Hematological/Lymphatic/Immunilogical: No cervical lymphadenopathy. Cardiovascular: Normal rate, regular rhythm. Grossly normal heart sounds.  Good peripheral circulation. Respiratory: Normal respiratory effort.  No retractions. Lungs Left lower lobe Rales Gastrointestinal: Soft and nontender. No distention. No abdominal bruits. No CVA  tenderness. Musculoskeletal: No lower extremity tenderness nor edema.  No joint effusions. Neurologic:  Normal speech and language. No gross focal neurologic deficits are appreciated. No gait instability. Skin:  Skin is warm, dry and intact. No rash noted. Psychiatric: Mood and affect are normal. Speech and behavior are normal.  ____________________________________________   LABS (all labs ordered are listed, but only abnormal results are displayed)  Labs Reviewed - No data to display ____________________________________________  EKG   ____________________________________________  RADIOLOGY  Chest x-ray reveals left atelectasis. ____________________________________________   PROCEDURES  Procedure(s) performed: None  Procedures  Critical Care performed: No  ____________________________________________   INITIAL IMPRESSION / ASSESSMENT AND PLAN / ED COURSE  Pertinent labs & imaging results that were available during my care of the patient were reviewed by me and considered in my medical decision making (see chart for details).  Respiratory infection. Patient given discharge Instructions. Patient given a prescription for Zithromax, ibuprofen, and Proventil DM. Patient advised follow-up family doctor condition persists.  Clinical Course    Patient present with cough for 1 week. Cough is nonproductive. Patient x-rays shows left lower lobe atelectasis. Patient responded well to one DuoNeb treatment.  ____________________________________________   FINAL CLINICAL IMPRESSION(S) / ED DIAGNOSES  Final diagnoses:  Atelectasis, left      NEW MEDICATIONS STARTED DURING THIS VISIT:  New Prescriptions   AZITHROMYCIN (ZITHROMAX Z-PAK) 250 MG TABLET    Take 2 tablets (500 mg) on  Day 1,  followed by 1 tablet (250 mg) once daily on Days 2 through 5.   BROMPHENIRAMINE-PSEUDOEPHEDRINE-DM 30-2-10 MG/5ML SYRUP    Take 5 mLs by mouth 4 (four) times daily as needed.   IBUPROFEN  (ADVIL,MOTRIN) 600 MG TABLET    Take 1 tablet (600 mg total) by mouth every 8 (eight) hours as needed.     Note:  This document was prepared using Dragon voice recognition software and may include unintentional dictation errors.    Joni Reiningonald K Cem Kosman, PA-C 02/29/16 [id number]    Phineas SemenGraydon Goodman, MD 02/29/16 [phone]

## 2016-02-29 NOTE — ED Triage Notes (Signed)
Pt with cough for one week. Denies any other sx.

## 2017-02-22 ENCOUNTER — Encounter: Payer: Self-pay | Admitting: Emergency Medicine

## 2017-02-22 ENCOUNTER — Other Ambulatory Visit: Payer: Self-pay

## 2017-02-22 ENCOUNTER — Emergency Department
Admission: EM | Admit: 2017-02-22 | Discharge: 2017-02-22 | Disposition: A | Discharge status: Home / Self Care | Payer: Commercial Managed Care - PPO | Attending: Emergency Medicine | Admitting: Emergency Medicine

## 2017-02-22 DIAGNOSIS — L509 Urticaria, unspecified: Secondary | ICD-10-CM | POA: Diagnosis not present

## 2017-02-22 MED ORDER — HYDROXYZINE HCL 25 MG PO TABS
25.0000 mg | ORAL_TABLET | Freq: Once | ORAL | Status: AC
  Administered 2017-02-22: 25 mg via ORAL
  Filled 2017-02-22: qty 1

## 2017-02-22 MED ORDER — METHYLPREDNISOLONE SODIUM SUCC 125 MG IJ SOLR
125.0000 mg | Freq: Once | INTRAMUSCULAR | Status: AC
  Administered 2017-02-22: 125 mg via INTRAMUSCULAR
  Filled 2017-02-22: qty 2

## 2017-02-22 MED ORDER — HYDROXYZINE HCL 25 MG PO TABS: ORAL_TABLET | ORAL | 0 refills | Status: AC

## 2017-02-22 MED ORDER — PREDNISONE 10 MG (21) PO TBPK: ORAL_TABLET | ORAL | 0 refills | Status: AC

## 2017-02-22 NOTE — ED Provider Notes (Signed)
[facility] Emergency Department Provider Note  ____________________________________________  Time seen: Approximately 5:10 PM  I have reviewed the triage vital signs and the nursing notes.   HISTORY  Chief Complaint Allergic Reaction   HPI Alyssa Page is a 46 y.o. female who presents to the emergency department for evaluation treatment of hives.  She states that the itching started last night.  She states the only thing she can really relate it to some pantyhose that she wore yesterday.  She states that she was on an antifungal, but stopped taking that on Wednesday which was 3 days ago.  She has taken Benadryl without relief.  Hives have continued to spread.  Past Medical History:  Diagnosis Date  . Diverticulitis     There are no active problems to display for this patient.   Past Surgical History:  Procedure Laterality Date  . ABDOMINAL SURGERY    . APPENDECTOMY    . Celiac artery decompression    . HERNIA REPAIR     x 3    Prior to Admission medications   Medication Sig Start Date End Date Taking? Authorizing Provider  brompheniramine-pseudoephedrine-DM 30-2-10 MG/5ML syrup Take 5 mLs by mouth 4 (four) times daily as needed. 02/29/16   Joni ReiningSmith, Ronald K, PA-C  ibuprofen (ADVIL,MOTRIN) 600 MG tablet Take 1 tablet (600 mg total) by mouth every 8 (eight) hours as needed. 02/29/16   Joni ReiningSmith, Ronald K, PA-C  oxyCODONE-acetaminophen (ROXICET) 5-325 MG tablet Take 1 tablet by mouth every 4 (four) hours as needed for severe pain. 02/01/15   Darci CurrentBrown, [place] N, MD    Allergies Zantac [ranitidine hcl]  History reviewed. No pertinent family history.  Social History Social History   Tobacco Use  . Smoking status: Never Smoker  . Smokeless tobacco: Never Used  Substance Use Topics  . Alcohol use: Yes    Comment: "Occassional glass of wine"  . Drug use: Not on file    Review of Systems  Constitutional: negative for fever. Respiratory: Negative  for cough or shortness of breath.  Musculoskeletal: negative for myalgias Skin: Positive for hives. Neurological: negative for numbness or paresthesias. ____________________________________________   PHYSICAL EXAM:  VITAL SIGNS: ED Triage Vitals [02/22/17 1553]  Enc Vitals Group     BP 137/83     Pulse Rate 82     Resp 16     Temp 97.8 F (36.6 C)     Temp Source Oral     SpO2 99 %     Weight 155 lb (70.3 kg)     Height 5\' 3"  (1.6 m)     Head Circumference      Peak Flow      Pain Score      Pain Loc      Pain Edu?      Excl. in GC?      Constitutional: Well appearing. Eyes: Conjunctivae are clear without discharge or drainage. Nose: No rhinorrhea noted. Mouth/Throat: Airway is patent.  Neck: No stridor. Unrestricted range of motion observed.  Cardiovascular: Capillary refill is <3 seconds.  Respiratory: Respirations are even and unlabored.. Musculoskeletal: Unrestricted range of motion observed. Neurologic: Awake, alert, and oriented x 4.  Skin: Erythematous urticarial lesions are widespread over the upper and lower extremities and trunk.  ____________________________________________   LABS (all labs ordered are listed, but only abnormal results are displayed)  Labs Reviewed - No data to display ____________________________________________  EKG  Not indicated ____________________________________________  RADIOLOGY  Not indicated ____________________________________________  PROCEDURES  Procedures ____________________________________________   INITIAL IMPRESSION / ASSESSMENT AND PLAN / ED COURSE  Alyssa FootmanMelissa D Victory DakinRiley is a 46 y.o. female who presents to the emergency department for treatment and evaluation of urticaria.  She is allergic to Zantac which she states causes the same type of reaction.  She denies taking any over-the-counter medications that may have included Zantac.  Today, she will be given an injection of Solu-Medrol 125 mg.  She will also  be given Atarax.  ----------------------------------------- 5:56 PM on 02/22/2017 -----------------------------------------  Some resolution of the urticarial lesions after administration of Solu-Medrol and Atarax.  She will be discharged home with a prescription for prednisone taper and Atarax.  She was instructed to return to the emergency department for symptoms of concern if she is unable to see her primary care provider.  Medications  methylPREDNISolone sodium succinate (SOLU-MEDROL) 125 mg/2 mL injection 125 mg (not administered)  hydrOXYzine (ATARAX/VISTARIL) tablet 25 mg (not administered)     Pertinent labs & imaging results that were available during my care of the patient were reviewed by me and considered in my medical decision making (see chart for details). ____________________________________________   FINAL CLINICAL IMPRESSION(S) / ED DIAGNOSES  Final diagnoses:  None    ED Discharge Orders    None       Note:  This document was prepared using Dragon voice recognition software and may include unintentional dictation errors.    Chinita Pesterriplett, Miliyah Luper B, FNP 02/22/17 1757    Phineas SemenGoodman, Graydon, MD 02/22/17 [phone]

## 2017-02-22 NOTE — ED Triage Notes (Signed)
Pt was on an antifungal that she stopped taking on weds

## 2017-02-22 NOTE — ED Notes (Signed)

## 2017-02-22 NOTE — ED Notes (Signed)
See triage note. Pt c/o itchy hives from head to toe starting last night. Denies any difficulty breathing. States she has taken 2 doses of 50mg  benadryl without resolution of symptoms, last dose 1200 today. Has previously had allergic reaction to zantac requiring IV therapy.

## 2017-02-22 NOTE — ED Triage Notes (Signed)
Allergic rxt possibly to pantyhose. Started on the legs now all over. Pt took 2 benadryl at 12pm

## 2017-02-22 NOTE — Discharge Instructions (Signed)
Return to the ER for symptoms of concern if you are unable to see your primary care provider.

## 2017-06-29 IMAGING — CR DG CHEST 2V
1 series · 2 of 2 positions shown · non-contrast
Comparison: 11/28/2015

CLINICAL DATA: Tachycardia

EXAM:
CHEST  2 VIEW

[Series 1: w chest pa · 0.14mm/px · 2 of 2 slices shown]
[im 1/2]
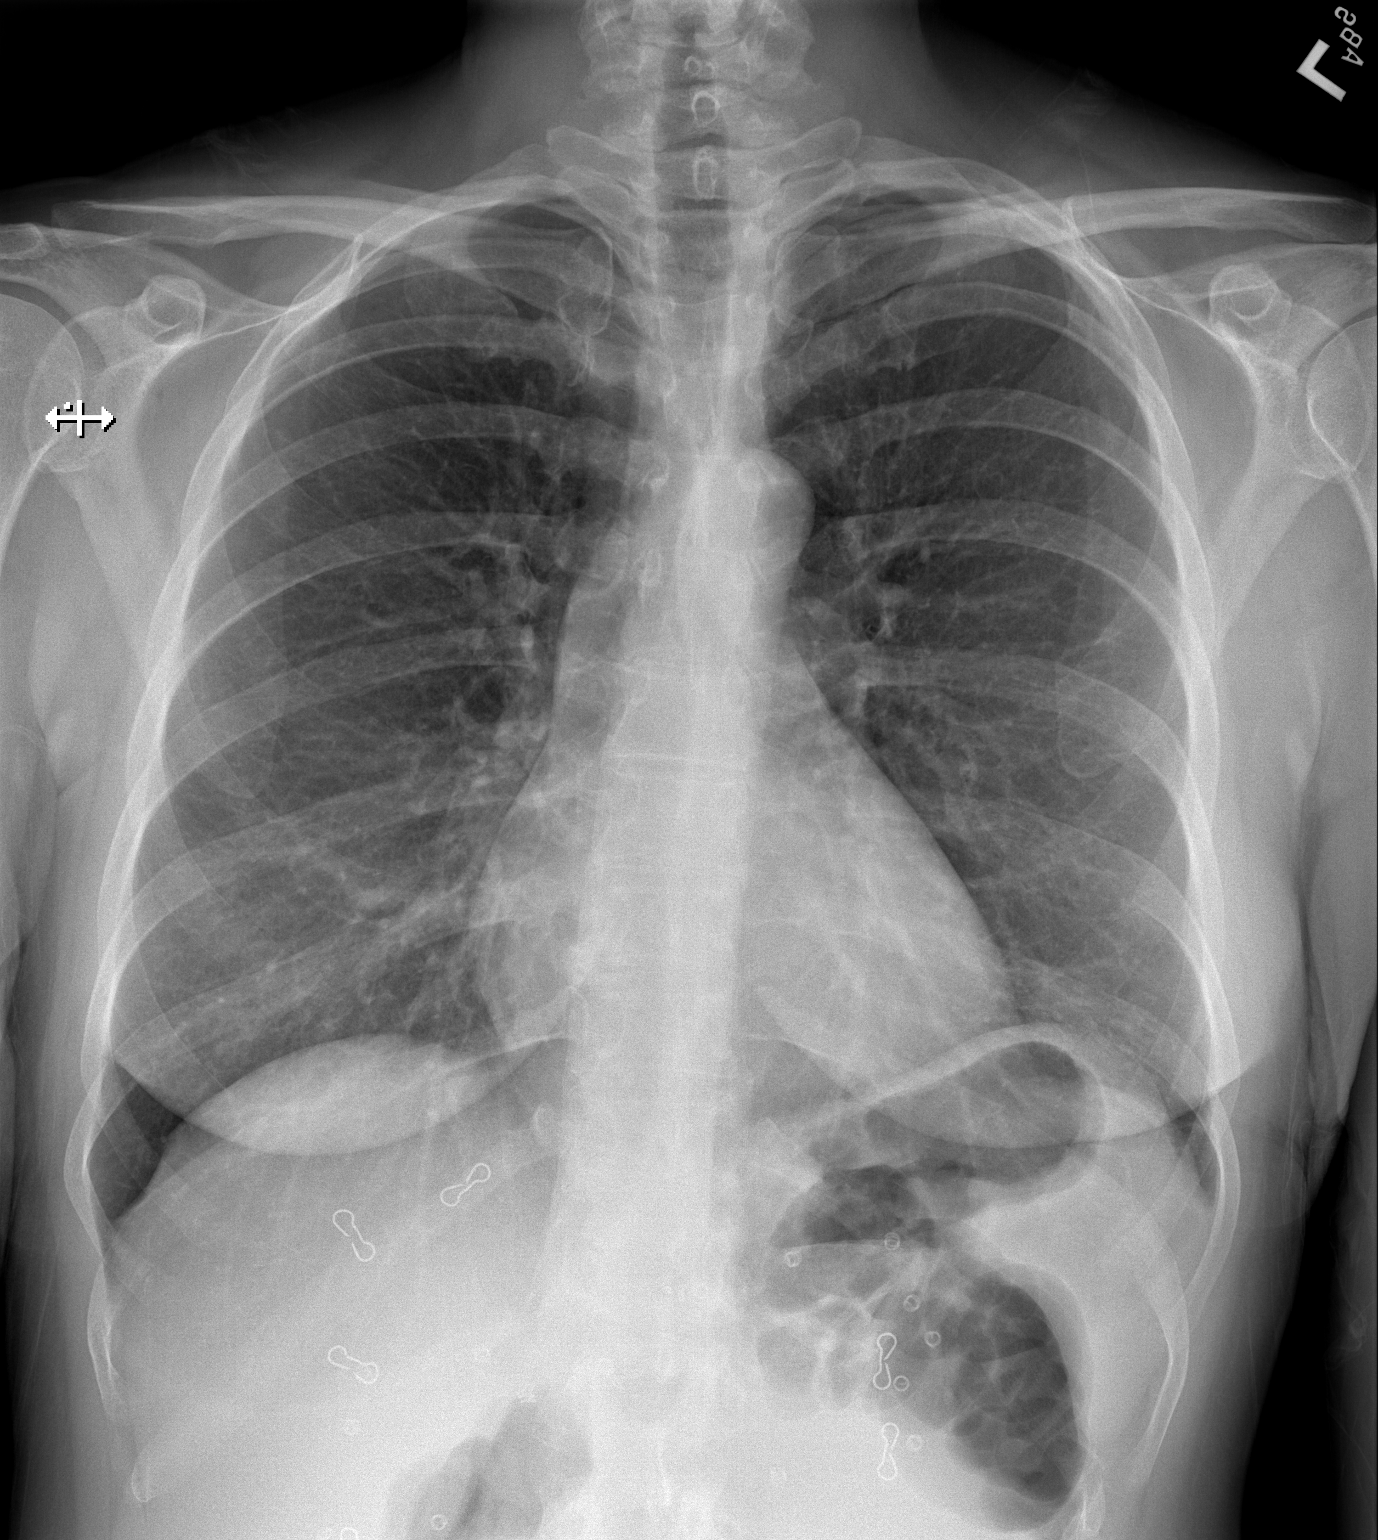
[im 2/2]
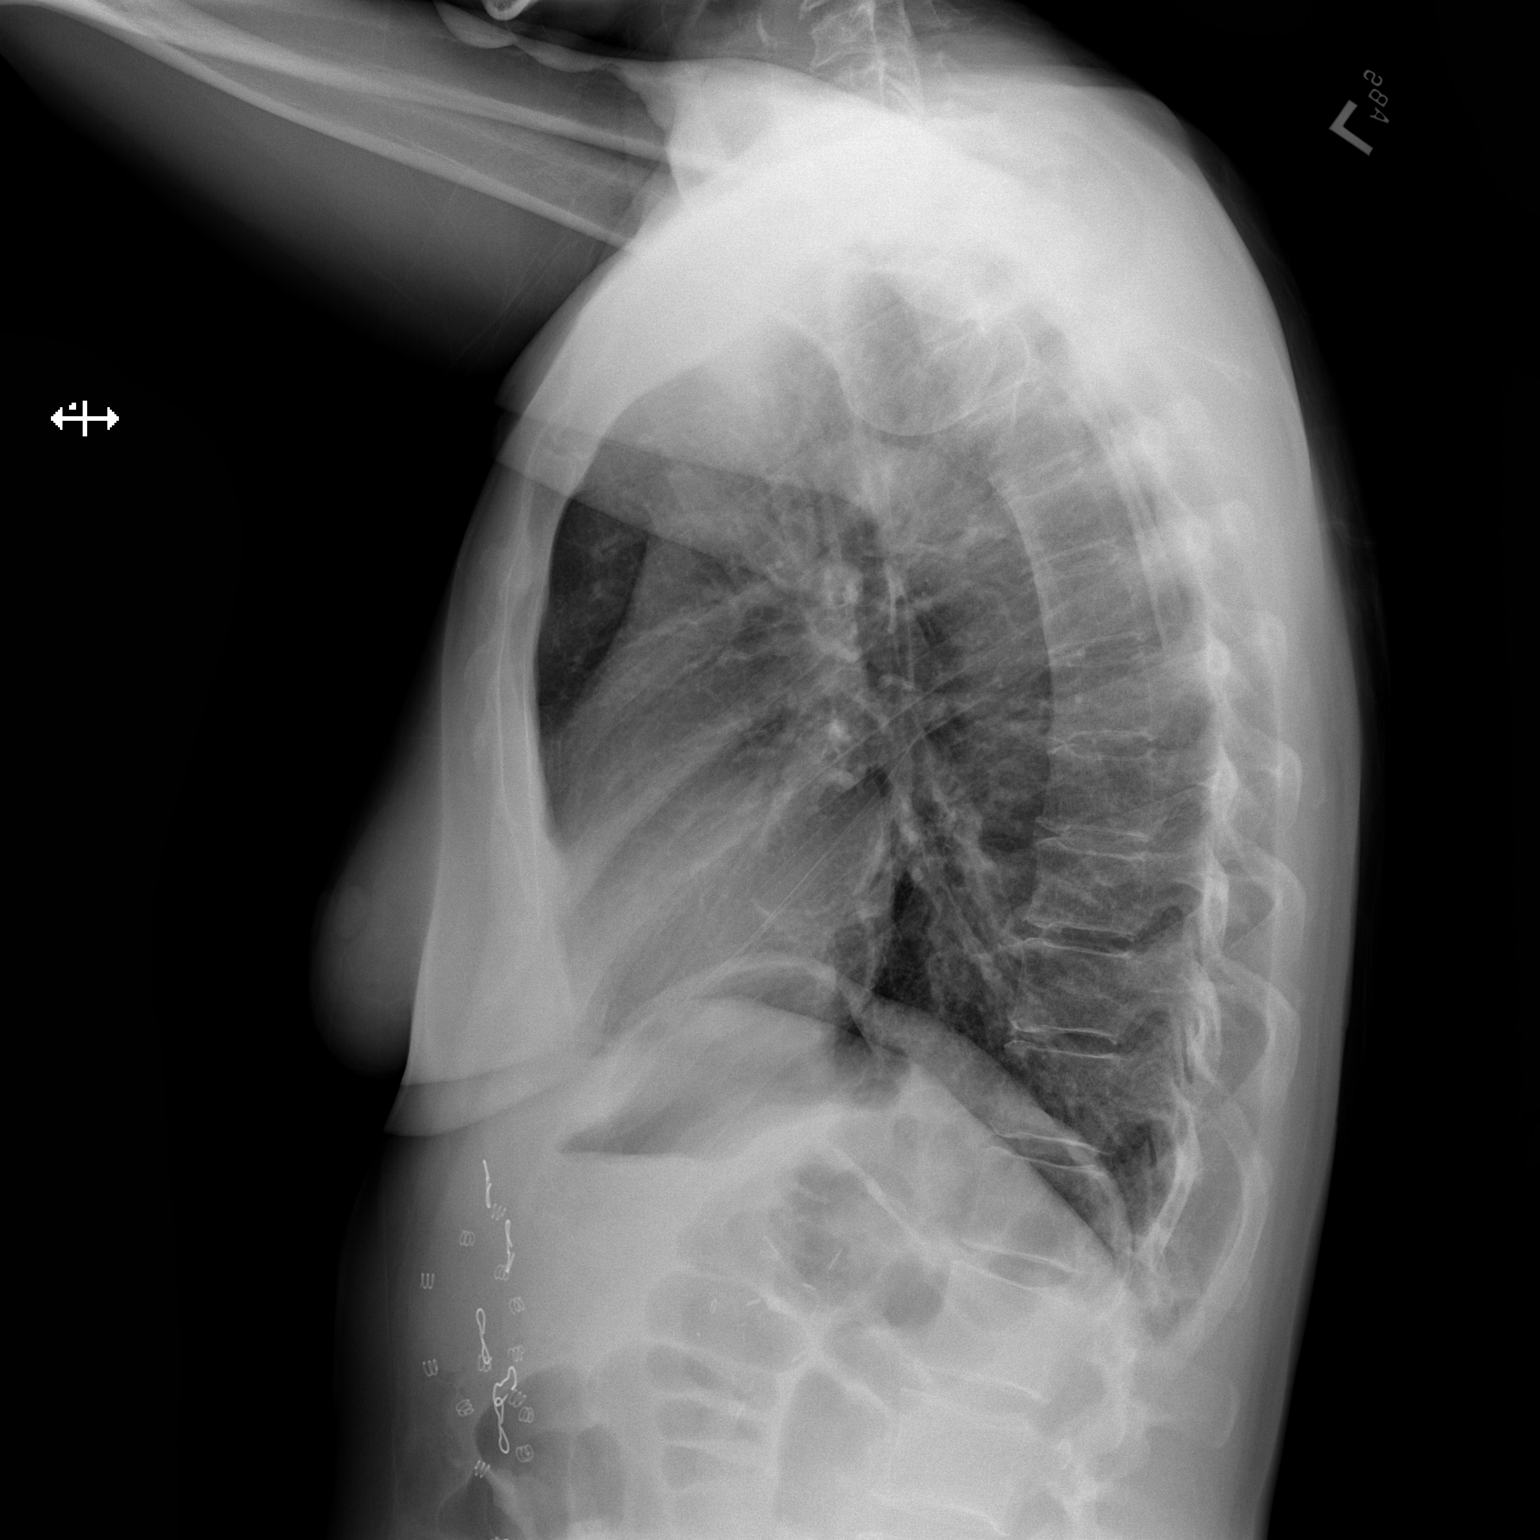

[2 of 2 positions shown; findings below may reference images not displayed]

FINDINGS: Cardiac shadow is within normal limits. The lungs are well aerated
bilaterally. No acute bony abnormality is seen. Postsurgical changes
in the abdomen are again noted.
IMPRESSION: No active cardiopulmonary disease.

## 2017-08-31 IMAGING — CR DG CHEST 2V
2 series · 2 of 2 positions shown · non-contrast
Comparison: December 28, 2015

CLINICAL DATA: One week history of cough and congestion

EXAM:
CHEST  2 VIEW

[chest pa]
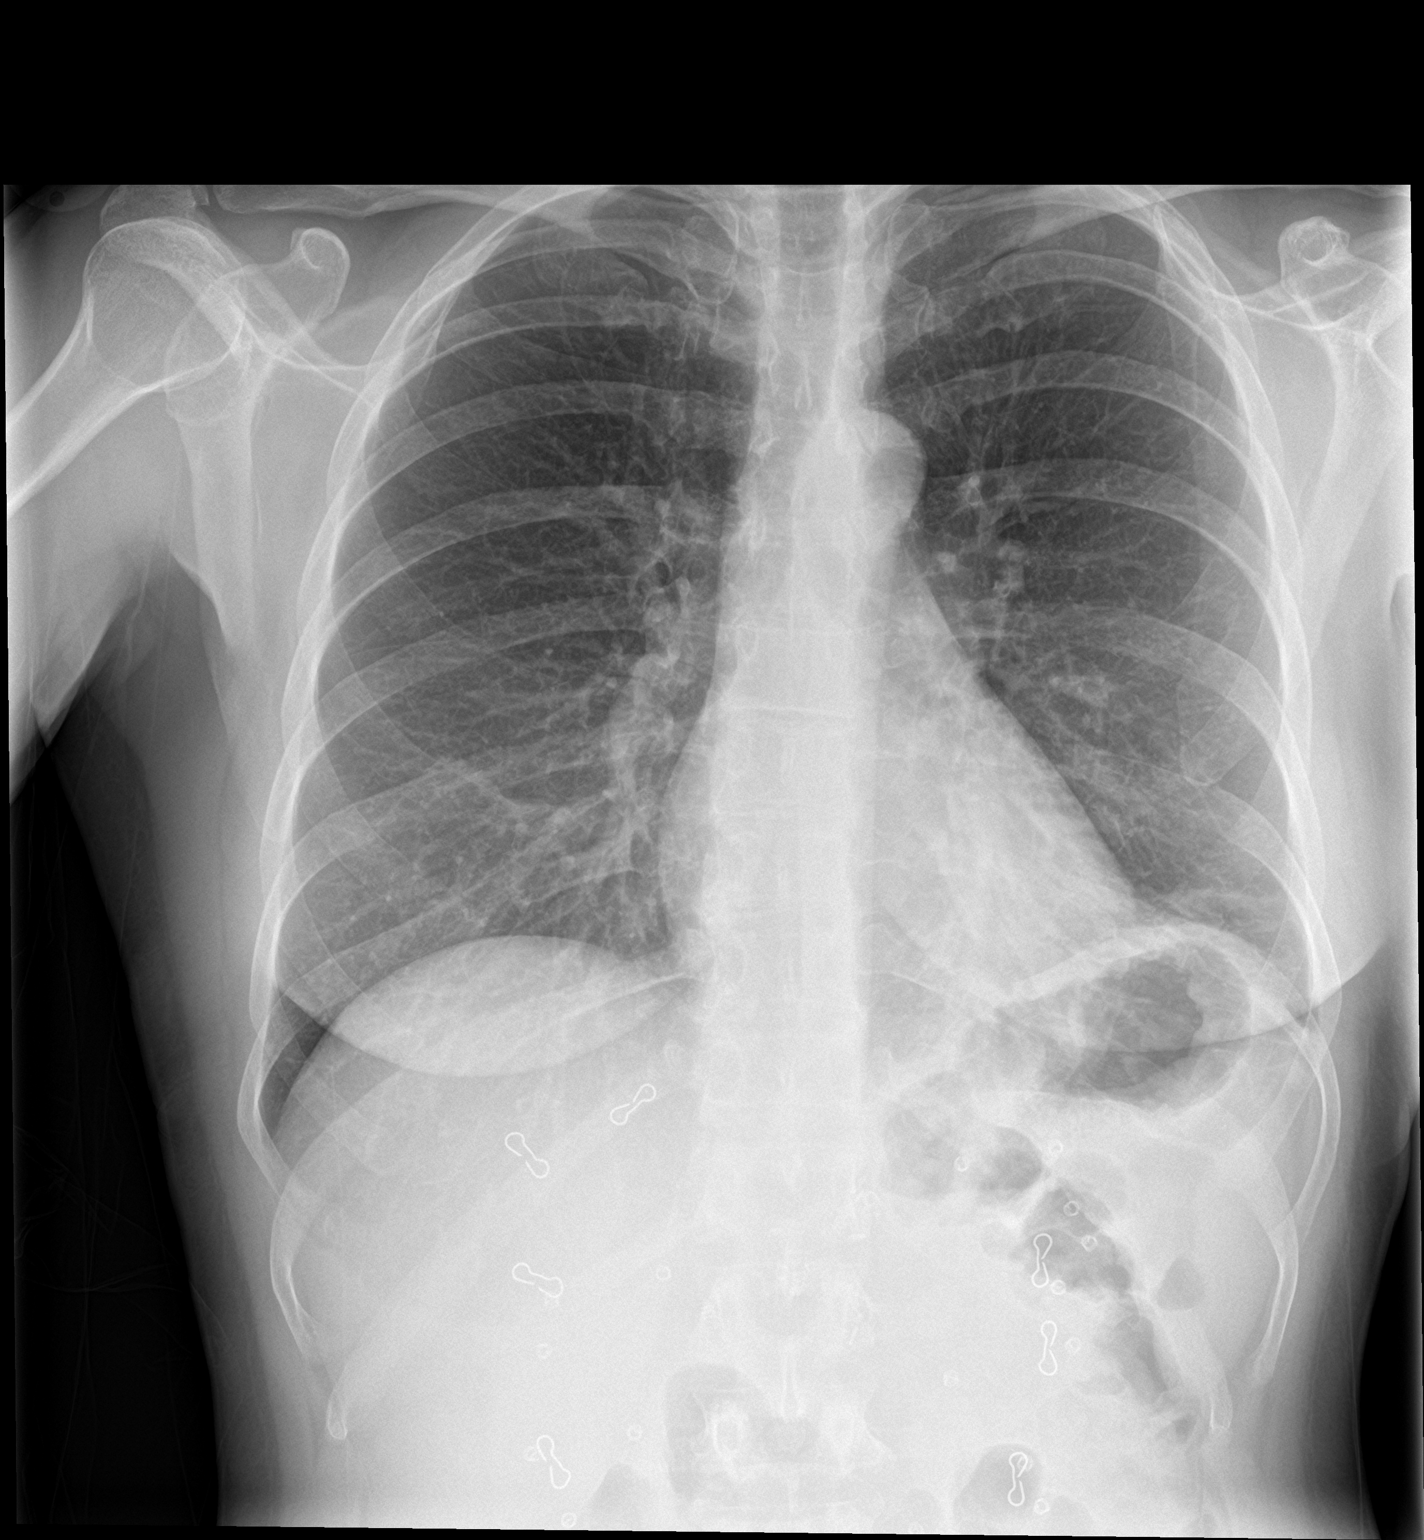

[chest lat]
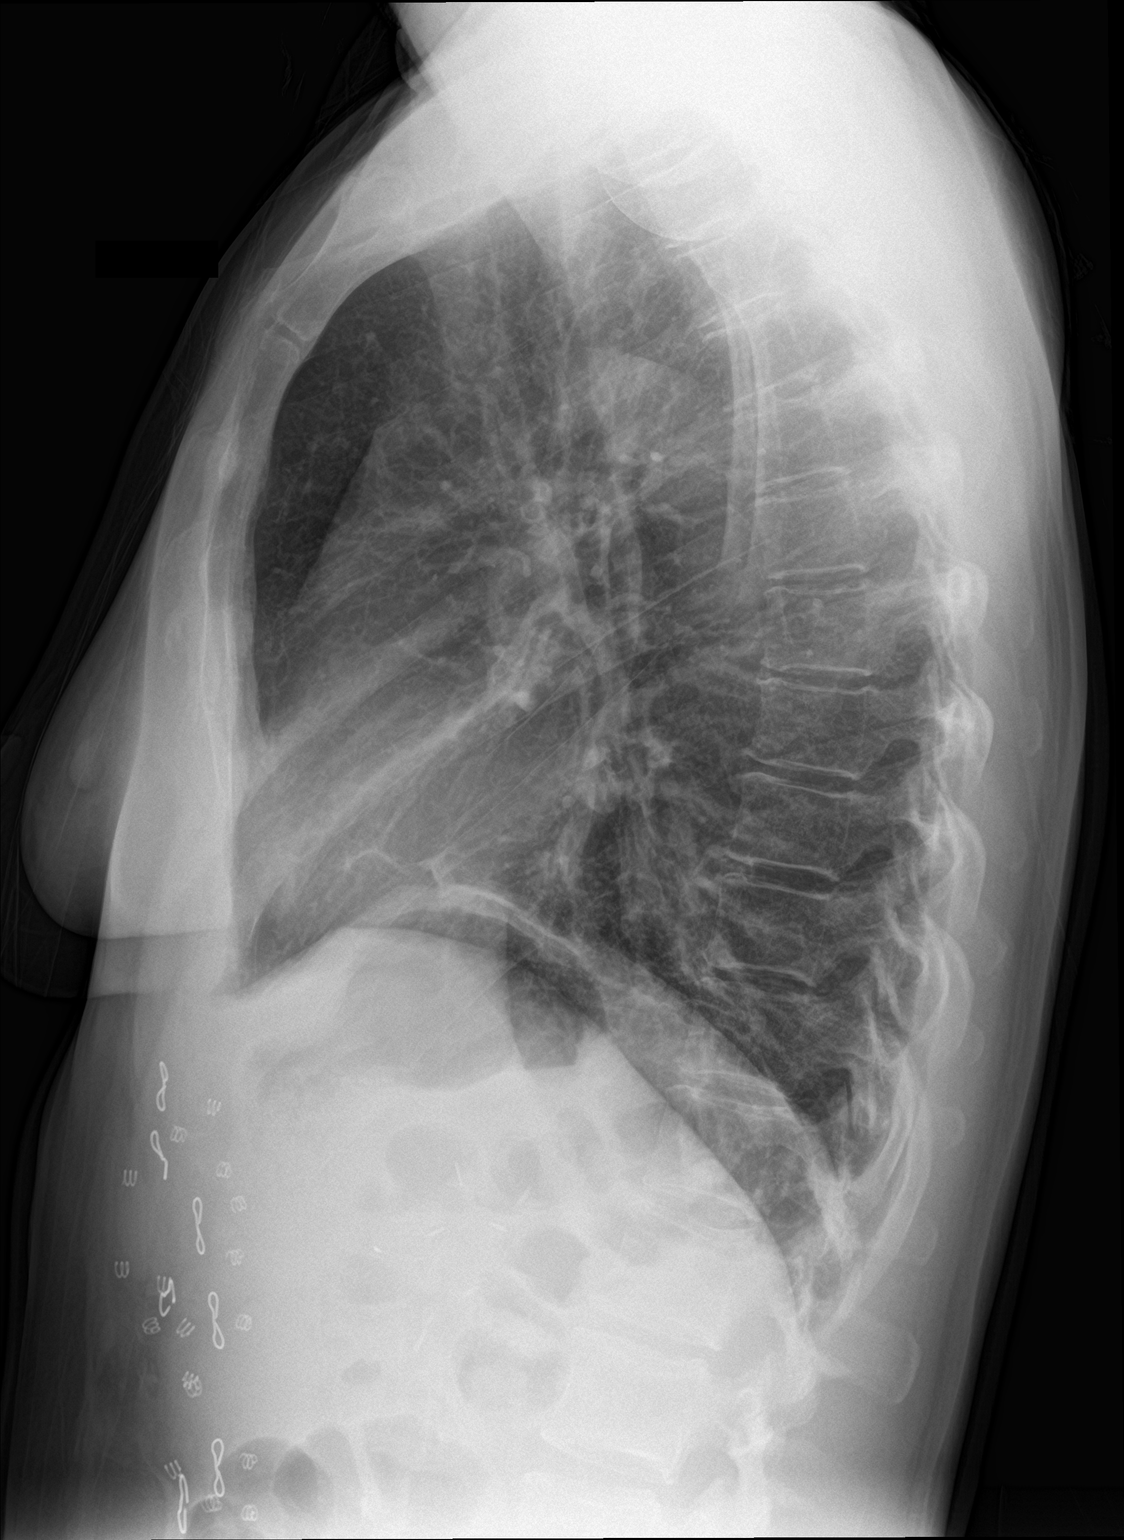

[2 of 2 positions shown; findings below may reference images not displayed]

FINDINGS: There is atelectatic change in the left base. Lungs elsewhere clear.
Heart size and pulmonary vascularity are normal. No adenopathy. No
bone lesions. Postoperative change noted in the visualized abdomen.
IMPRESSION: Left base atelectasis. Lungs elsewhere clear. Stable cardiac
silhouette.

## 2023-11-24 ENCOUNTER — Other Ambulatory Visit: Payer: Self-pay | Admitting: Medical Genetics
